# Patient Record
Sex: Male | Born: 1970 | Race: White | Hispanic: No | Marital: Married | Smoking: Former smoker
Health system: Southern US, Community
[De-identification: ages and names within clinical notes are randomized; demographics above are authoritative.]

## PROBLEM LIST (undated history)

## (undated) DIAGNOSIS — F32A Depression, unspecified: Secondary | ICD-10-CM

## (undated) DIAGNOSIS — F419 Anxiety disorder, unspecified: Secondary | ICD-10-CM

## (undated) DIAGNOSIS — F329 Major depressive disorder, single episode, unspecified: Secondary | ICD-10-CM

## (undated) HISTORY — PX: FINGER SURGERY: SHX640

---

## 2007-11-21 ENCOUNTER — Ambulatory Visit: Payer: Self-pay | Admitting: General Practice

## 2011-10-16 ENCOUNTER — Emergency Department: Payer: Self-pay | Admitting: Unknown Physician Specialty

## 2011-10-17 LAB — CBC
HGB: 14.6 g/dL (ref 13.0–18.0)
MCHC: 34 g/dL (ref 32.0–36.0)
RBC: 4.78 10*6/uL (ref 4.40–5.90)
WBC: 7.5 10*3/uL (ref 3.8–10.6)

## 2011-10-17 LAB — BASIC METABOLIC PANEL
BUN: 15 mg/dL (ref 7–18)
Chloride: 102 mmol/L (ref 98–107)
Co2: 31 mmol/L (ref 21–32)
Creatinine: 1.33 mg/dL — ABNORMAL HIGH (ref 0.60–1.30)
Potassium: 3.4 mmol/L — ABNORMAL LOW (ref 3.5–5.1)

## 2011-10-17 LAB — CK TOTAL AND CKMB (NOT AT ARMC): CK, Total: 146 U/L (ref 35–232)

## 2012-10-02 ENCOUNTER — Emergency Department (HOSPITAL_COMMUNITY)
Admission: EM | Admit: 2012-10-02 | Discharge: 2012-10-02 | Disposition: A | Discharge status: Home / Self Care | Payer: 59 | Attending: Emergency Medicine | Admitting: Emergency Medicine

## 2012-10-02 ENCOUNTER — Encounter (HOSPITAL_COMMUNITY): Payer: Self-pay | Admitting: Emergency Medicine

## 2012-10-02 ENCOUNTER — Emergency Department (HOSPITAL_COMMUNITY): Payer: 59

## 2012-10-02 DIAGNOSIS — F411 Generalized anxiety disorder: Secondary | ICD-10-CM | POA: Insufficient documentation

## 2012-10-02 DIAGNOSIS — R0789 Other chest pain: Secondary | ICD-10-CM | POA: Insufficient documentation

## 2012-10-02 DIAGNOSIS — R079 Chest pain, unspecified: Secondary | ICD-10-CM

## 2012-10-02 DIAGNOSIS — Z8659 Personal history of other mental and behavioral disorders: Secondary | ICD-10-CM | POA: Insufficient documentation

## 2012-10-02 DIAGNOSIS — Z79899 Other long term (current) drug therapy: Secondary | ICD-10-CM | POA: Insufficient documentation

## 2012-10-02 DIAGNOSIS — F430 Acute stress reaction: Secondary | ICD-10-CM | POA: Insufficient documentation

## 2012-10-02 DIAGNOSIS — Z87891 Personal history of nicotine dependence: Secondary | ICD-10-CM | POA: Insufficient documentation

## 2012-10-02 DIAGNOSIS — Z880 Allergy status to penicillin: Secondary | ICD-10-CM | POA: Insufficient documentation

## 2012-10-02 HISTORY — DX: Major depressive disorder, single episode, unspecified: F32.9

## 2012-10-02 HISTORY — DX: Anxiety disorder, unspecified: F41.9

## 2012-10-02 HISTORY — DX: Depression, unspecified: F32.A

## 2012-10-02 LAB — CBC
HCT: 45.7 % (ref 39.0–52.0)
Hemoglobin: 16 g/dL (ref 13.0–17.0)
MCH: 31.3 pg (ref 26.0–34.0)
MCHC: 35 g/dL (ref 30.0–36.0)
MCV: 89.3 fL (ref 78.0–100.0)
Platelets: 268 10*3/uL (ref 150–400)
RBC: 5.12 MIL/uL (ref 4.22–5.81)
RDW: 13.1 % (ref 11.5–15.5)
WBC: 8 10*3/uL (ref 4.0–10.5)

## 2012-10-02 LAB — BASIC METABOLIC PANEL
CO2: 27 mEq/L (ref 19–32)
Chloride: 96 mEq/L (ref 96–112)
Glucose, Bld: 91 mg/dL (ref 70–99)
Potassium: 4 mEq/L (ref 3.5–5.1)
Sodium: 138 mEq/L (ref 135–145)

## 2012-10-02 LAB — POCT I-STAT TROPONIN I: Troponin i, poc: 0 ng/mL (ref 0.00–0.08)

## 2012-10-02 NOTE — ED Notes (Signed)
Pt reports left sided chest pain, has been intermittent x1 year. Reports celebrated birthday last night and drank ETOH, chest pain started when pt woke up this morning. Denies smoking. Pain 8/10. Reports slight SOB

## 2012-10-02 NOTE — ED Provider Notes (Signed)
CSN: [id number]     Arrival date & time 10/02/12  1625 History     First MD Initiated Contact with Patient 10/02/12 1814     Chief Complaint  Patient presents with  . Chest Pain   (Consider location/radiation/quality/duration/timing/severity/associated sxs/prior Treatment) HPI Comments: 42 yo male with past smoking hx, social etoh presents with intermittent left sided chest pain for 1 year.  Nothing specific worsens.  No exertional sx, sob or diaphoresis.  Left upper chest/ axillar region, no focal radiation.  Patient denies blood clot history, active cancer, recent major trauma or surgery, unilateral leg swelling/ pain, recent long travel, hemoptysis or oral contraceptives.  Mild sxs.  Pt has been stressed and always feels tension, anxiety sxs for the past year.  NO FH of cardiac.  No cocaine.  Pt feels okay in ED.    Patient is a 42 y.o. male presenting with chest pain. The history is provided by the patient.  Chest Pain Pain location:  L chest Associated symptoms: no abdominal pain, no back pain, no fever, no headache, no palpitations, no shortness of breath and not vomiting     Past Medical History  Diagnosis Date  . Depression   . Anxiety    Past Surgical History  Procedure Laterality Date  . Finger surgery     No family history on file. History  Substance Use Topics  . Smoking status: Former Smoker -- 1.50 packs/day for 15 years    Types: Cigarettes    Quit date: 02/23/1998  . Smokeless tobacco: Current User    Types: Chew  . Alcohol Use: 14.4 oz/week    24 Cans of beer per week    Review of Systems  Constitutional: Negative for fever and chills.  HENT: Negative for neck pain and neck stiffness.   Eyes: Negative for visual disturbance.  Respiratory: Negative for shortness of breath.   Cardiovascular: Positive for chest pain. Negative for palpitations and leg swelling.  Gastrointestinal: Negative for vomiting and abdominal pain.  Genitourinary: Negative for  dysuria and flank pain.  Musculoskeletal: Negative for back pain.  Skin: Negative for rash.  Neurological: Negative for light-headedness and headaches.    Allergies  Penicillins  Home Medications   Current Outpatient Rx  Name  Route  Sig  Dispense  Refill  . esomeprazole (NEXIUM) 20 MG capsule   Oral   Take 40 mg by mouth daily before breakfast.          There were no vitals taken for this visit. Physical Exam  Nursing note and vitals reviewed. Constitutional: He is oriented to person, place, and time. He appears well-developed and well-nourished.  HENT:  Head: Normocephalic and atraumatic.  Eyes: Conjunctivae are normal. Right eye exhibits no discharge. Left eye exhibits no discharge.  Neck: Normal range of motion. Neck supple. No tracheal deviation present.  Cardiovascular: Normal rate, regular rhythm and intact distal pulses.   No murmur heard. Pulmonary/Chest: Effort normal and breath sounds normal.  Abdominal: Soft. He exhibits no distension. There is no tenderness. There is no guarding.  Musculoskeletal: He exhibits no edema and no tenderness.  Neurological: He is alert and oriented to person, place, and time.  Skin: Skin is warm. No rash noted.  Psychiatric: He has a normal mood and affect.    ED Course   Procedures (including critical care time)  Labs Reviewed  CBC  BASIC METABOLIC PANEL  POCT I-STAT TROPONIN I   Dg Chest 1 View  10/02/2012   *RADIOLOGY REPORT*  Clinical Data: Chest pain  CHEST - 1 VIEW  Comparison: None.  Findings: Normal heart size.  Lungs are hyperaerated and clear.  No pneumothorax.  No acute bony deformity.  IMPRESSION: The no active cardiopulmonary disease.   Original Report Authenticated By: Jolaine Click, M.D.   1. Chest pain     MDM  Pt very low risk CAD and PE. PERC negative, no concern for PE at this time.  Constant pain for months, troponin neg.  Mild ache on exam.   Discussed PPI and outpt stress.  Pt comfortable with plan.   Wife in the room. Reasons to return stressed.   Date: 10/02/2012  Rate: 76  Rhythm: normal sinus rhythm  QRS Axis: normal  Intervals: normal  ST/T Wave abnormalities: mild rsr  Conduction Disutrbances:none  Narrative Interpretation:   Old EKG Reviewed: none available  Discharged.   Enid Skeens, MD 10/02/12 [phone]

## 2012-10-02 NOTE — ED Notes (Signed)
Pt reports left sided chest pain that has been intermittent for the past year. Pt states the pain radiates to the left axilla. Pt describes the pain as tension, burning, and aching. Pt also reports dizziness. Pt has a history of smoking, however states he quit in his late 54s. Pt is A/O x4 and in NAD.

## 2015-03-31 IMAGING — CR DG CHEST 1V
1 series · 1 of 1 positions shown · non-contrast
Comparison: None.

CLINICAL DATA: Chest pain

CHEST - 1 VIEW

[w chest pa]
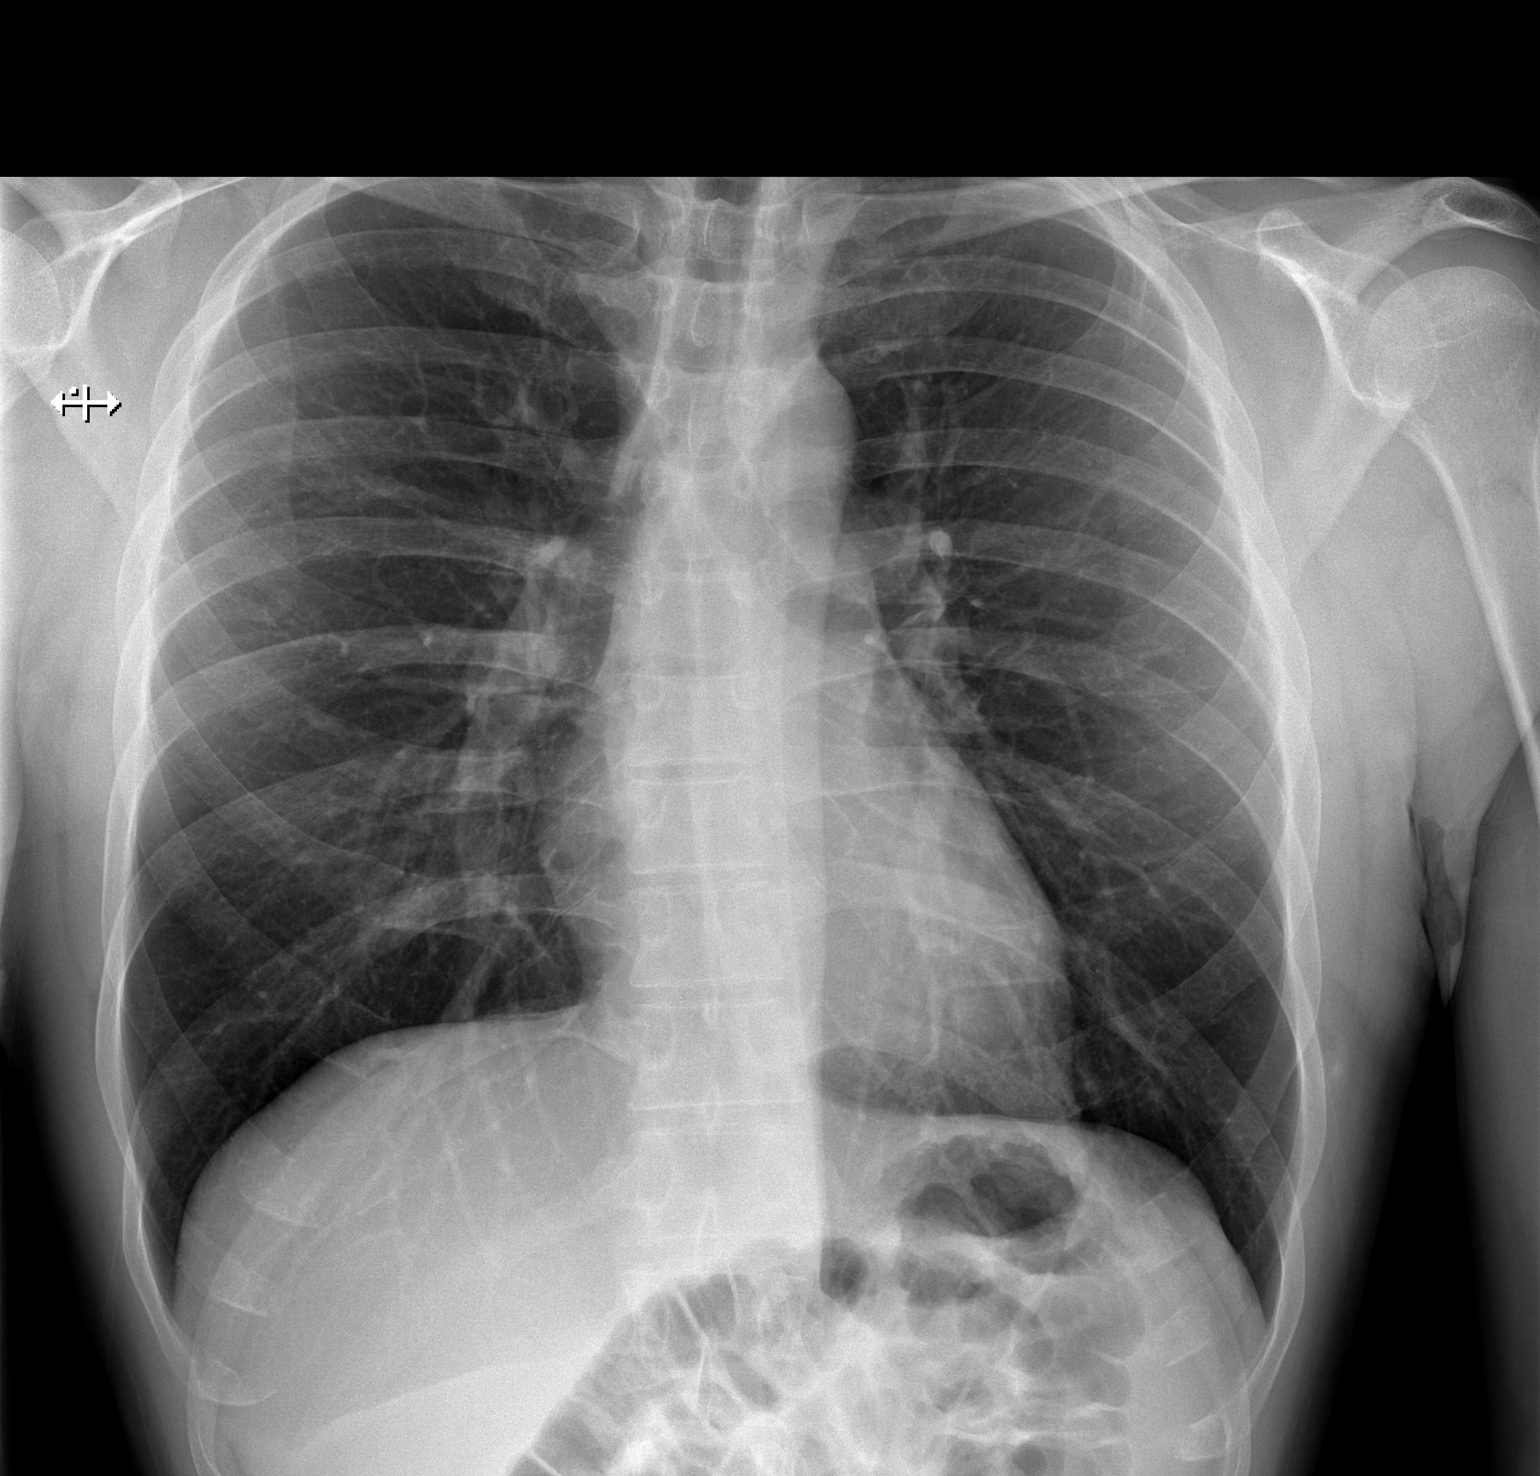

[1 of 1 positions shown; findings below may reference images not displayed]

FINDINGS: Normal heart size.  Lungs are hyperaerated and clear.  No
pneumothorax.  No acute bony deformity.
IMPRESSION: The no active cardiopulmonary disease.

## 2021-10-02 ENCOUNTER — Encounter: Payer: Self-pay | Admitting: Medical

## 2021-10-02 ENCOUNTER — Ambulatory Visit: Payer: BC Managed Care – PPO | Admitting: Medical

## 2021-10-02 ENCOUNTER — Ambulatory Visit (INDEPENDENT_AMBULATORY_CARE_PROVIDER_SITE_OTHER): Payer: BC Managed Care – PPO

## 2021-10-02 VITALS — BP 138/88 | HR 86 | Temp 97.6°F | Ht 71.0 in | Wt 173.4 lb

## 2021-10-02 DIAGNOSIS — Z1211 Encounter for screening for malignant neoplasm of colon: Secondary | ICD-10-CM

## 2021-10-02 DIAGNOSIS — Z0000 Encounter for general adult medical examination without abnormal findings: Secondary | ICD-10-CM | POA: Diagnosis not present

## 2021-10-02 DIAGNOSIS — Z125 Encounter for screening for malignant neoplasm of prostate: Secondary | ICD-10-CM | POA: Diagnosis not present

## 2021-10-02 DIAGNOSIS — N50812 Left testicular pain: Secondary | ICD-10-CM

## 2021-10-02 DIAGNOSIS — N433 Hydrocele, unspecified: Secondary | ICD-10-CM | POA: Diagnosis not present

## 2021-10-02 LAB — LIPID PANEL
Cholesterol: 291 mg/dL — ABNORMAL HIGH (ref 0–200)
HDL: 59 mg/dL (ref >39.00)
LDL Cholesterol: 197 mg/dL — ABNORMAL HIGH (ref 0–99)
NonHDL: 232.08
Total CHOL/HDL Ratio: 5
Triglycerides: 175 mg/dL — ABNORMAL HIGH (ref 0.0–149.0)
VLDL: 35 mg/dL (ref 0.0–40.0)

## 2021-10-02 LAB — CBC WITH DIFFERENTIAL/PLATELET
Basophils Absolute: 0 10*3/uL (ref 0.0–0.1)
Basophils Relative: 0.7 % (ref 0.0–3.0)
Eosinophils Absolute: 0.2 10*3/uL (ref 0.0–0.7)
Eosinophils Relative: 3.1 % (ref 0.0–5.0)
HCT: 44.9 % (ref 39.0–52.0)
Hemoglobin: 15.2 g/dL (ref 13.0–17.0)
Lymphocytes Relative: 26 % (ref 12.0–46.0)
Lymphs Abs: 1.4 10*3/uL (ref 0.7–4.0)
MCHC: 33.8 g/dL (ref 30.0–36.0)
MCV: 90.6 fl (ref 78.0–100.0)
Monocytes Absolute: 0.4 10*3/uL (ref 0.1–1.0)
Monocytes Relative: 7.1 % (ref 3.0–12.0)
Neutro Abs: 3.4 10*3/uL (ref 1.4–7.7)
Neutrophils Relative %: 63.1 % (ref 43.0–77.0)
Platelets: 211 10*3/uL (ref 150.0–400.0)
RBC: 4.95 Mil/uL (ref 4.22–5.81)
RDW: 13.5 % (ref 11.5–15.5)
WBC: 5.5 10*3/uL (ref 4.0–10.5)

## 2021-10-02 LAB — COMPREHENSIVE METABOLIC PANEL
ALT: 18 U/L (ref 0–53)
AST: 17 U/L (ref 0–37)
Albumin: 4.8 g/dL (ref 3.5–5.2)
Alkaline Phosphatase: 48 U/L (ref 39–117)
BUN: 12 mg/dL (ref 6–23)
CO2: 31 mEq/L (ref 19–32)
Calcium: 9.9 mg/dL (ref 8.4–10.5)
Chloride: 99 mEq/L (ref 96–112)
Creatinine, Ser: 1.24 mg/dL (ref 0.40–1.50)
GFR: 68.51 mL/min (ref >60.00)
Glucose, Bld: 101 mg/dL — ABNORMAL HIGH (ref 70–99)
Potassium: 4.6 mEq/L (ref 3.5–5.1)
Sodium: 137 mEq/L (ref 135–145)
Total Bilirubin: 0.5 mg/dL (ref 0.2–1.2)
Total Protein: 7.1 g/dL (ref 6.0–8.3)

## 2021-10-02 LAB — PSA: PSA: 0.61 ng/mL (ref 0.10–4.00)

## 2021-10-02 NOTE — Progress Notes (Addendum)
Subjective:    Patient ID: Raymond Paul, male    DOB: 1970-04-29, 51 y.o.   MRN: [id number]  HPI  Pt in for first time with me.  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 Pt states planning to work out regularly. Pt describes moderate healthy diet. None smoker. He states 9 beers a week spread out of 3 days. Thursday, Friday or Saturday.  He states only 1 cup of coffee a day.  On review no recent depression or anxiety though on history.   Prior gerd. He associated that when drank more alcohol and when was anxious.  Pt not aware if he is update on tdap. He expresses concern about side effects.  One month ago had rash weed eating and pulling weeds. He got rash on calfs and left forearm. He used topical med otc and got better after calamine.     Review of Systems  Constitutional:  Negative for chills, fatigue and fever.  Respiratory:  Negative for cough, chest tightness and wheezing.   Cardiovascular:  Negative for chest pain and palpitations.  Gastrointestinal:  Negative for abdominal pain.  Genitourinary:  Negative for dysuria and enuresis.       Doe report sporacid intermittent left side testicle pain. Had this morning today. In past would last at most 2 days. Other times very brief pain in past. States 5 years ago was evaluated. More than 10 years ago had [place].  Faint pain presently. Barely noticeable now.     Musculoskeletal:  Negative for back pain, myalgias and neck stiffness.  Skin:  Negative for rash.       Hx of rash. See hpi.  Neurological:  Negative for dizziness, speech difficulty, light-headedness and headaches.  Hematological:  Negative for adenopathy. Does not bruise/bleed easily.  Psychiatric/Behavioral:  Negative for behavioral problems and decreased concentration.     Past Medical History:  Diagnosis Date   Anxiety    Depression      Social History   Socioeconomic History   Marital status: Married    Spouse name: Not on file   Number of children: Not on  file   Years of education: Not on file   Highest education level: Not on file  Occupational History   Not on file  Tobacco Use   Smoking status: Former    Packs/day: 1.50    Years: 20.00    Total pack years: 30.00    Types: Cigarettes    Quit date: 02/23/1998    Years since quitting: 23.6   Smokeless tobacco: Current    Types: Chew  Substance and Sexual Activity   Alcohol use: Not Currently    Alcohol/week: 9.0 standard drinks of alcohol    Types: 9 Cans of beer per week    Comment: states spread out over 3 days. 3 beers each of those day.   Drug use: No   Sexual activity: Yes  Other Topics Concern   Not on file  Social History Narrative   Not on file   Social Determinants of Health   Financial Resource Strain: Not on file  Food Insecurity: Not on file  Transportation Needs: Not on file  Physical Activity: Not on file  Stress: Not on file  Social Connections: Not on file  Intimate Partner Violence: Not on file    Past Surgical History:  Procedure Laterality Date   FINGER SURGERY      History reviewed. No pertinent family history.  Allergies  Allergen Reactions   Penicillins  No current outpatient medications on file prior to visit.   No current facility-administered medications on file prior to visit.    BP 138/88   Pulse 86   Temp 97.6 F (36.4 C) (Oral)   Ht 5\' 11"  (1.803 m)   Wt 173 lb 6.4 oz (78.7 kg)   SpO2 99%   BMI 24.18 kg/m        Objective:   Physical Exam  General Mental Status- Alert. General Appearance- Not in acute distress.   Skin Faint hyperpigmented area left forearm after rash one month ago.  Neck Carotid Arteries- Normal color. Moisture- Normal Moisture. No carotid bruits. No JVD.  Chest and Lung Exam Auscultation: Breath Sounds:-Normal.  Cardiovascular Auscultation:Rythm- Regular. Murmurs & Other Heart Sounds:Auscultation of the heart reveals- No Murmurs.  Abdomen Inspection:-Inspeection  Normal. Palpation/Percussion:Note:No mass. Palpation and Percussion of the abdomen reveal- Non Tender, Non Distended + BS, no rebound or guarding.   Neurologic Cranial Nerve exam:- CN III-XII intact(No nystagmus), symmetric smile. Strength:- 5/5 equal and symmetric strength both upper and lower extremities.   Genital exam- mild tenderness to palpation in region of the epididymus. No lump felt. Rt testicle normal. Inguinal canals clear from henria.     Assessment & Plan:     For you wellness exam today I have ordered cbc, cmp and lipid panel.  Psa screening.  Will place referral to GI MD for colonoscopy.  Tdap declined. Can get later as nurse visit if you decide.  Recommend exercise and healthy diet.  We will let you know lab results as they come in.  Follow up date appointment will be determined after lab review.    Mild skin hyperpigmentation following poison ivy rash 1 month ago. At this point recommend moisturizing lotion with vit E.   For hx of anxiety if you feel in future issue significant enough for med let me know. Or can refer for counseling.  With recurrent left testicle pain intermittent for 10 years and some today do think best to go ahead and order scrotal . Order placed. Can go down now to get scheduled with [place].  [place], PA-C

## 2021-10-02 NOTE — Patient Instructions (Addendum)
For you wellness exam today I have ordered cbc, cmp and lipid panel.  Psa screening.  Will place referral to GI MD for colonoscopy.  Tdap declined. Can get later as nurse visit if you decide.  Recommend exercise and healthy diet.  We will let you know lab results as they come in.  Follow up date appointment will be determined after lab review.    Mild skin hyperpigmentation following poison ivy rash 1 month ago. At this point recommend moisturizing lotion with vit E.   For hx of anxiety if you feel in future issue significant enough for med let me know. Or can refer for counseling.  With recurrent left testicle pain intermittent for 10 years and some today do think best to go ahead and order scrotal US. Order placed. Can go down now to get scheduled with [place].  Mild elevated bp today. Recommend checking bp on occasion relaxed at home to see if closer to 130/80.   Preventive Care 21-60 Years Old, Male Preventive care refers to lifestyle choices and visits with your health care provider that can promote health and wellness. Preventive care visits are also called wellness exams. What can I expect for my preventive care visit? Counseling During your preventive care visit, your health care provider may ask about your: Medical history, including: Past medical problems. Family medical history. Current health, including: Emotional well-being. Home life and relationship well-being. Sexual activity. Lifestyle, including: Alcohol, nicotine or tobacco, and drug use. Access to firearms. Diet, exercise, and sleep habits. Safety issues such as seatbelt and bike helmet use. Sunscreen use. Work and work Astronomer. Physical exam Your health care provider will check your: Height and weight. These may be used to calculate your BMI (body mass index). BMI is a measurement that tells if you are at a healthy weight. Waist circumference. This measures the distance around your waistline. This  measurement also tells if you are at a healthy weight and may help predict your risk of certain diseases, such as type 2 diabetes and high blood pressure. Heart rate and blood pressure. Body temperature. Skin for abnormal spots. What immunizations do I need?  Vaccines are usually given at various ages, according to a schedule. Your health care provider will recommend vaccines for you based on your age, medical history, and lifestyle or other factors, such as travel or where you work. What tests do I need? Screening Your health care provider may recommend screening tests for certain conditions. This may include: Lipid and cholesterol levels. Diabetes screening. This is done by checking your blood sugar (glucose) after you have not eaten for a while (fasting). Hepatitis B test. Hepatitis C test. HIV (human immunodeficiency virus) test. STI (sexually transmitted infection) testing, if you are at risk. Lung cancer screening. Prostate cancer screening. Colorectal cancer screening. Talk with your health care provider about your test results, treatment options, and if necessary, the need for more tests. Follow these instructions at home: Eating and drinking  Eat a diet that includes fresh fruits and vegetables, whole grains, lean protein, and low-fat dairy products. Take vitamin and mineral supplements as recommended by your health care provider. Do not drink alcohol if your health care provider tells you not to drink. If you drink alcohol: Limit how much you have to 0-2 drinks a day. Know how much alcohol is in your drink. In the [place] one drink equals one 12 oz bottle of beer (355 mL), one 5 oz glass of wine (148 mL), or one 1 oz  glass of hard liquor (44 mL). Lifestyle Brush your teeth every morning and night with fluoride toothpaste. Floss one time each day. Exercise for at least 30 minutes 5 or more days each week. Do not use any products that contain nicotine or tobacco. These products  include cigarettes, chewing tobacco, and vaping devices, such as e-cigarettes. If you need help quitting, ask your health care provider. Do not use drugs. If you are sexually active, practice safe sex. Use a condom or other form of protection to prevent STIs. Take aspirin only as told by your health care provider. Make sure that you understand how much to take and what form to take. Work with your health care provider to find out whether it is safe and beneficial for you to take aspirin daily. Find healthy ways to manage stress, such as: Meditation, yoga, or listening to music. Journaling. Talking to a trusted person. Spending time with friends and family. Minimize exposure to UV radiation to reduce your risk of skin cancer. Safety Always wear your seat belt while driving or riding in a vehicle. Do not drive: If you have been drinking alcohol. Do not ride with someone who has been drinking. When you are tired or distracted. While texting. If you have been using any mind-altering substances or drugs. Wear a helmet and other protective equipment during sports activities. If you have firearms in your house, make sure you follow all gun safety procedures. What's next? Go to your health care provider once a year for an annual wellness visit. Ask your health care provider how often you should have your eyes and teeth checked. Stay up to date on all vaccines. This information is not intended to replace advice given to you by your health care provider. Make sure you discuss any questions you have with your health care provider. Document Revised: 08/07/2020 Document Reviewed: 08/07/2020 Elsevier Patient Education  2023 [employer].

## 2021-10-02 NOTE — Addendum Note (Signed)
Addended by: Gwenevere Abbot on: 10/02/2021 10:59 AM   Modules accepted: Orders, Level of Service

## 2021-11-07 ENCOUNTER — Encounter: Payer: Self-pay | Admitting: Medical

## 2023-04-21 DIAGNOSIS — R21 Rash and other nonspecific skin eruption: Secondary | ICD-10-CM | POA: Diagnosis not present
